# Patient Record
Sex: Male | Born: 2000 | Race: White | Hispanic: No | Marital: Single | Smoking: Never smoker
Health system: Southern US, Community
[De-identification: ages and names within clinical notes are randomized; demographics above are authoritative.]

## PROBLEM LIST (undated history)

## (undated) DIAGNOSIS — T7840XA Allergy, unspecified, initial encounter: Secondary | ICD-10-CM

## (undated) DIAGNOSIS — F32A Depression, unspecified: Secondary | ICD-10-CM

## (undated) DIAGNOSIS — F329 Major depressive disorder, single episode, unspecified: Secondary | ICD-10-CM

## (undated) HISTORY — DX: Allergy, unspecified, initial encounter: T78.40XA

## (undated) HISTORY — DX: Depression, unspecified: F32.A

---
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
## 2019-01-18 ENCOUNTER — Other Ambulatory Visit: Payer: Self-pay

## 2019-01-18 DIAGNOSIS — Z20822 Contact with and (suspected) exposure to covid-19: Secondary | ICD-10-CM

## 2019-01-20 LAB — NOVEL CORONAVIRUS, NAA: SARS-CoV-2, NAA: NOT DETECTED

## 2019-02-21 ENCOUNTER — Other Ambulatory Visit: Payer: Self-pay | Admitting: Physician Assistant

## 2019-02-21 DIAGNOSIS — F329 Major depressive disorder, single episode, unspecified: Secondary | ICD-10-CM

## 2019-02-21 DIAGNOSIS — F32A Depression, unspecified: Secondary | ICD-10-CM

## 2019-02-21 NOTE — Telephone Encounter (Signed)
Review for refill. Paxil 10 mg tab, has end date of 02/22/2018.

## 2019-02-23 NOTE — Telephone Encounter (Signed)
L.O.V. was 11/25/2018 and no upcoming appointment. Please advised.

## 2019-04-10 ENCOUNTER — Ambulatory Visit: Payer: No Typology Code available for payment source | Admitting: Physician Assistant

## 2019-04-11 ENCOUNTER — Other Ambulatory Visit: Payer: Self-pay

## 2019-04-11 ENCOUNTER — Telehealth (INDEPENDENT_AMBULATORY_CARE_PROVIDER_SITE_OTHER): Payer: No Typology Code available for payment source | Admitting: Physician Assistant

## 2019-04-11 DIAGNOSIS — J069 Acute upper respiratory infection, unspecified: Secondary | ICD-10-CM

## 2019-04-11 NOTE — Progress Notes (Signed)
Patient: Peter Gomez Male    DOB: 2000-12-27   19 y.o.   MRN: [id number] Visit Date: 04/11/2019  Today's Provider: [name], PA-C   Chief Complaint  Patient presents with  . URI   Subjective:    I [place] S. Dimas, CMA, am acting as Education administrator for [employer]. Virtual Visit via Video Note  I connected with Peter Gomez on 04/11/19 at 10:20 AM EST by a video enabled telemedicine application and verified that I am speaking with the correct person using two identifiers.  Location: Patient: Home Provider: Office   I discussed the limitations of evaluation and management by telemedicine and the availability of in person appointments. The patient expressed understanding and agreed to proceed.  HPI Upper Respiratory Infection: Patient complains of symptoms of a URI, possible sinusitis. Symptoms include left ear pain, congestion, cough and sore throat. Onset of symptoms was 3 days ago, unchanged since that time. He also c/o congestion, cough described as productive of clear sputum and post nasal drip for the past 3 days .  He is drinking plenty of fluids. Evaluation to date: none. Treatment to date: antihistamines, cough suppressants and decongestants.  Reports having a fever on Friday with a temperature 99.0 F. Reports younger brothers are sick in a similar way. Has tried mucinex to break up mucous and tylenol. Patient had COVID in 12/2018.   Allergies  Allergen Reactions  . Penicillins Other (See Comments)    Severe family history. No personal history known      Current Outpatient Medications:  .  PARoxetine (PAXIL) 10 MG tablet, TAKE 1 TABLET BY MOUTH EVERY DAY, Disp: 90 tablet, Rfl: 0  Review of Systems  Constitutional: Negative for chills and fever.  HENT: Positive for congestion, ear pain, postnasal drip, sinus pressure, sinus pain and sore throat.   Respiratory: Positive for cough.     Social History   Tobacco Use  . Smoking status: Never Smoker  .  Smokeless tobacco: Never Used  Substance Use Topics  . Alcohol use: Never      Objective:   There were no vitals taken for this visit. There were no vitals filed for this visit.There is no height or weight on file to calculate BMI.   Physical Exam Constitutional:      Appearance: Normal appearance.  Pulmonary:     Effort: Pulmonary effort is normal. No respiratory distress.  Neurological:     Mental Status: He is alert.  Psychiatric:        Mood and Affect: Mood normal.        Behavior: Behavior normal.      No results found for any visits on 04/11/19.     Assessment & Plan    1. Viral URI  As he had COVID within the past three months I think it is unlikely to be COVID but not impossible. I do not think he should be retested however due to risk of false positive. Has some policies with work to navigate. Counseled on 10 day course of illness which he is halfway through. Recommend adding anti-inflammatories like ibuprofen and Sudafed to Mucinex and tylenol over the next three days. If not improving, call back 04/17/2019 for abx.   Of note, patient did not show up to 04/10/2019 appointment at 4:00 PM as scheduled, which will be documented as a no-show.    I discussed the assessment and treatment plan with the patient. The patient was provided an opportunity  to ask questions and all were answered. The patient agreed with the plan and demonstrated an understanding of the instructions.   The patient was advised to call back or seek an in-person evaluation if the symptoms worsen or if the condition fails to improve as anticipated.  I provided 25 minutes of non-face-to-face time during this encounter.  The entirety of the information documented in the History of Present Illness, Review of Systems and Physical Exam were personally obtained by me. Portions of this information were initially documented by Rondel Baton, CMA and reviewed by me for thoroughness and accuracy.       Trey Sailors, PA-C  [facility]

## 2019-05-24 ENCOUNTER — Other Ambulatory Visit: Payer: Self-pay | Admitting: Physician Assistant

## 2019-05-24 DIAGNOSIS — F32A Depression, unspecified: Secondary | ICD-10-CM

## 2019-05-24 DIAGNOSIS — F329 Major depressive disorder, single episode, unspecified: Secondary | ICD-10-CM

## 2019-06-22 NOTE — Progress Notes (Signed)
I,Laura E Walsh,acting as a Neurosurgeon for [employer], PA-C.,have documented all relevant documentation on the behalf of Trey Sailors, PA-C,as directed by  Trey Sailors, PA-C while in the presence of Trey Sailors, PA-C.     Established patient visit   Patient: Peter Gomez   DOB: [birth year]   19 y.o. Male  MRN: [id number] Visit Date: 06/23/2019  Today's healthcare provider: Trey Sailors, PA-C   Chief Complaint  Patient presents with  . Back Pain    Chronic lower back pain.   Subjective    Back Pain This is a chronic problem. The problem has been gradually worsening since onset. The pain is present in the lumbar spine. The quality of the pain is described as aching and shooting (Throbbing). Radiates to: Radiates down both legs. The symptoms are aggravated by bending, sitting and standing. Associated symptoms include headaches (Recent Migraines.), leg pain, numbness, tingling and weakness. Pertinent negatives include no bladder incontinence, bowel incontinence, dysuria, fever or weight loss. He has tried home exercises and NSAIDs for the symptoms. The treatment provided no relief.     Patient Active Problem List   Diagnosis Date Noted  . Lumbar back pain 06/23/2019  . Postural kyphosis of thoracic region 06/23/2019  . Hearing loss 11/25/2018  . Depression 11/25/2018   No past surgical history on file. Allergies  Allergen Reactions  . Penicillins Other (See Comments)    Severe family history. No personal history known      Medications: Outpatient Medications Prior to Visit  Medication Sig  . PARoxetine (PAXIL) 10 MG tablet TAKE 1 TABLET BY MOUTH EVERY DAY   No facility-administered medications prior to visit.    Review of Systems  Constitutional: Negative.  Negative for fever and weight loss.  Respiratory: Negative.   Cardiovascular: Negative.   Gastrointestinal: Negative.  Negative for bowel incontinence.  Genitourinary: Negative for bladder  incontinence and dysuria.  Musculoskeletal: Positive for back pain. Negative for arthralgias, gait problem, joint swelling, myalgias, neck pain and neck stiffness.  Neurological: Positive for tingling, weakness, numbness and headaches (Recent Migraines.).  Hematological: Negative.       Objective    BP 112/72 (BP Location: Left Arm, Patient Position: Sitting, Cuff Size: Large)   Pulse 81   Temp (!) 97.1 F (36.2 C) (Temporal)   Wt 264 lb (119.7 kg)   SpO2 97%   BMI 35.80 kg/m    Physical Exam Constitutional:      Appearance: Normal appearance.  Eyes:     Conjunctiva/sclera: Conjunctivae normal.  Musculoskeletal:     Cervical back: Normal.     Lumbar back: Tenderness and bony tenderness present. Normal range of motion. Positive right straight leg raise test and positive left straight leg raise test.     Comments: Kyphosis noted on exam Thoracic back.  Skin:    General: Skin is warm and dry.  Neurological:     Mental Status: He is alert and oriented to person, place, and time. Mental status is at baseline.  Psychiatric:        Mood and Affect: Mood normal.        Behavior: Behavior normal.        Thought Content: Thought content normal.        Judgment: Judgment normal.       No results found for any visits on 06/23/19.  Assessment & Plan     1. Postural kyphosis of thoracic region  - DG Thoracic Spine  W/Swimmers  2. Lumbar back pain  Chronic and uncontrolled Will get X-rays Meloxicam and cyclobenzaprine as below. Advised pt that cyclobenzaprine may cause doziness, and also not to use other NSAIDs including ibuprofen, naproxen, and aspirin while taking Meloxicam.  Pt can take tylenol.  Discussed referral to Physical therapy or back specialist for further evaluation if symptoms do not improve or worsening.     - DG Cervical Spine Complete - DG Thoracic Spine W/Swimmers - DG Lumbar Spine Complete - meloxicam (MOBIC) 15 MG tablet; Take 1 tablet (15 mg total) by  mouth daily.  Dispense: 30 tablet; Refill: 0 - cyclobenzaprine (FLEXERIL) 10 MG tablet; Take 1 tablet (10 mg total) by mouth at bedtime.  Dispense: 30 tablet; Refill: 0   Return if symptoms worsen or fail to improve.      ITrinna Post, PA-C, have reviewed all documentation for this visit. The documentation on 06/23/19 for the exam, diagnosis, procedures, and orders are all accurate and complete.    Paulene Floor  [facility] [phone] (phone) [phone] (fax)  [place]

## 2019-06-23 ENCOUNTER — Other Ambulatory Visit: Payer: Self-pay

## 2019-06-23 ENCOUNTER — Ambulatory Visit
Admission: RE | Admit: 2019-06-23 | Discharge: 2019-06-23 | Disposition: A | Discharge status: Home / Self Care | Payer: No Typology Code available for payment source | Source: Ambulatory Visit | Attending: Physician Assistant | Admitting: Physician Assistant

## 2019-06-23 ENCOUNTER — Ambulatory Visit (INDEPENDENT_AMBULATORY_CARE_PROVIDER_SITE_OTHER): Payer: No Typology Code available for payment source | Admitting: Physician Assistant

## 2019-06-23 ENCOUNTER — Encounter: Payer: Self-pay | Admitting: Physician Assistant

## 2019-06-23 VITALS — BP 112/72 | HR 81 | Temp 97.1°F | Wt 264.0 lb

## 2019-06-23 DIAGNOSIS — M542 Cervicalgia: Secondary | ICD-10-CM | POA: Diagnosis not present

## 2019-06-23 DIAGNOSIS — M545 Low back pain, unspecified: Secondary | ICD-10-CM

## 2019-06-23 DIAGNOSIS — M4004 Postural kyphosis, thoracic region: Secondary | ICD-10-CM

## 2019-06-23 DIAGNOSIS — M546 Pain in thoracic spine: Secondary | ICD-10-CM | POA: Diagnosis not present

## 2019-06-23 MED ORDER — CYCLOBENZAPRINE HCL 10 MG PO TABS: 10.0000 mg | ORAL_TABLET | Freq: Every day | ORAL | 0 refills | Status: DC

## 2019-06-23 MED ORDER — MELOXICAM 15 MG PO TABS: 15.0000 mg | ORAL_TABLET | Freq: Every day | ORAL | 0 refills | Status: DC

## 2019-06-23 NOTE — Patient Instructions (Signed)
Acute Back Pain, Adult Acute back pain is sudden and usually short-lived. It is often caused by an injury to the muscles and tissues in the back. The injury may result from:  A muscle or ligament getting overstretched or torn (strained). Ligaments are tissues that connect bones to each other. Lifting something improperly can cause a back strain.  Wear and tear (degeneration) of the spinal disks. Spinal disks are circular tissue that provides cushioning between the bones of the spine (vertebrae).  Twisting motions, such as while playing sports or doing yard work.  A hit to the back.  Arthritis. You may have a physical exam, lab tests, and imaging tests to find the cause of your pain. Acute back pain usually goes away with rest and home care. Follow these instructions at home: Managing pain, stiffness, and swelling  Take over-the-counter and prescription medicines only as told by your health care provider.  Your health care provider may recommend applying ice during the first 24-48 hours after your pain starts. To do this: ? Put ice in a plastic bag. ? Place a towel between your skin and the bag. ? Leave the ice on for 20 minutes, 2-3 times a day.  If directed, apply heat to the affected area as often as told by your health care provider. Use the heat source that your health care provider recommends, such as a moist heat pack or a heating pad. ? Place a towel between your skin and the heat source. ? Leave the heat on for 20-30 minutes. ? Remove the heat if your skin turns bright red. This is especially important if you are unable to feel pain, heat, or cold. You have a greater risk of getting burned. Activity   Do not stay in bed. Staying in bed for more than 1-2 days can delay your recovery.  Sit up and stand up straight. Avoid leaning forward when you sit, or hunching over when you stand. ? If you work at a desk, sit close to it so you do not need to lean over. Keep your chin tucked  in. Keep your neck drawn back, and keep your elbows bent at a right angle. Your arms should look like the letter "L." ? Sit high and close to the steering wheel when you drive. Add lower back (lumbar) support to your car seat, if needed.  Take short walks on even surfaces as soon as you are able. Try to increase the length of time you walk each day.  Do not sit, drive, or stand in one place for more than 30 minutes at a time. Sitting or standing for long periods of time can put stress on your back.  Do not drive or use heavy machinery while taking prescription pain medicine.  Use proper lifting techniques. When you bend and lift, use positions that put less stress on your back: ? Bend your knees. ? Keep the load close to your body. ? Avoid twisting.  Exercise regularly as told by your health care provider. Exercising helps your back heal faster and helps prevent back injuries by keeping muscles strong and flexible.  Work with a physical therapist to make a safe exercise program, as recommended by your health care provider. Do any exercises as told by your physical therapist. Lifestyle  Maintain a healthy weight. Extra weight puts stress on your back and makes it difficult to have good posture.  Avoid activities or situations that make you feel anxious or stressed. Stress and anxiety increase muscle   tension and can make back pain worse. Learn ways to manage anxiety and stress, such as through exercise. General instructions  Sleep on a firm mattress in a comfortable position. Try lying on your side with your knees slightly bent. If you lie on your back, put a pillow under your knees.  Follow your treatment plan as told by your health care provider. This may include: ? Cognitive or behavioral therapy. ? Acupuncture or massage therapy. ? Meditation or yoga. Contact a health care provider if:  You have pain that is not relieved with rest or medicine.  You have increasing pain going down  into your legs or buttocks.  Your pain does not improve after 2 weeks.  You have pain at night.  You lose weight without trying.  You have a fever or chills. Get help right away if:  You develop new bowel or bladder control problems.  You have unusual weakness or numbness in your arms or legs.  You develop nausea or vomiting.  You develop abdominal pain.  You feel faint. Summary  Acute back pain is sudden and usually short-lived.  Use proper lifting techniques. When you bend and lift, use positions that put less stress on your back.  Take over-the-counter and prescription medicines and apply heat or ice as directed by your health care provider. This information is not intended to replace advice given to you by your health care provider. Make sure you discuss any questions you have with your health care provider. Document Revised: 05/24/2018 Document Reviewed: 09/16/2016 Elsevier Patient Education  2020 [employer]  

## 2019-06-26 ENCOUNTER — Encounter: Payer: Self-pay | Admitting: Physician Assistant

## 2019-06-26 DIAGNOSIS — M545 Low back pain, unspecified: Secondary | ICD-10-CM

## 2019-08-04 ENCOUNTER — Other Ambulatory Visit: Payer: Self-pay | Admitting: Physical Medicine & Rehabilitation

## 2019-08-04 DIAGNOSIS — M5416 Radiculopathy, lumbar region: Secondary | ICD-10-CM

## 2019-08-09 NOTE — Progress Notes (Deleted)
     Established patient visit   Patient: Peter Gomez   DOB: Jan 24, 2001   19 y.o. Male  MRN: [id number] Visit Date: 08/10/2019  Today's healthcare provider: Trey Sailors, PA-C   No chief complaint on file.  Subjective    HPI Depression, Follow-up  He  was last seen for this 8 months ago. Changes made at last visit include started Paxil 10 mg.   He reports {excellent/good/fair/poor:19665} compliance with treatment. He {is/is not:21021397} having side effects. ***  He reports {DESC; GOOD/FAIR/POOR:18685} tolerance of treatment. Current symptoms include: {Symptoms; depression:1002} He feels he is {improved/worse/unchanged:3041574} since last visit.  Depression screen [facility] 2/9 04/11/2019 11/25/2018  Decreased Interest 0 1  Down, Depressed, Hopeless 1 1  PHQ - 2 Score 1 2  Altered sleeping 1 2  Tired, decreased energy 1 1  Change in appetite 0 0  Feeling bad or failure about yourself  0 0  Trouble concentrating 1 0  Moving slowly or fidgety/restless 0 0  Suicidal thoughts 0 0  PHQ-9 Score 4 5  Difficult doing work/chores Somewhat difficult Somewhat difficult    -----------------------------------------------------------------------------------------   {Show patient history (optional):23778::" "}   Medications: Outpatient Medications Prior to Visit  Medication Sig  . cyclobenzaprine (FLEXERIL) 10 MG tablet Take 1 tablet (10 mg total) by mouth at bedtime.  . meloxicam (MOBIC) 15 MG tablet Take 1 tablet (15 mg total) by mouth daily.  Marland Kitchen PARoxetine (PAXIL) 10 MG tablet TAKE 1 TABLET BY MOUTH EVERY DAY   No facility-administered medications prior to visit.    Review of Systems  {Heme  Chem  Endocrine  Serology  Results Review (optional):23779::" "}  Objective    There were no vitals taken for this visit. {Show previous vital signs (optional):23777::" "}  Physical Exam  ***  No results found for any visits on 08/10/19.  Assessment & Plan     ***  No  follow-ups on file.      {provider attestation***:1}   Maryella Shivers  [facility] [phone] (phone) [phone] (fax)  [facility]

## 2019-08-10 ENCOUNTER — Ambulatory Visit: Payer: No Typology Code available for payment source | Admitting: Physician Assistant

## 2019-08-17 ENCOUNTER — Other Ambulatory Visit: Payer: Self-pay

## 2019-08-17 ENCOUNTER — Ambulatory Visit
Admission: RE | Admit: 2019-08-17 | Discharge: 2019-08-17 | Disposition: A | Discharge status: Home / Self Care | Payer: Medicaid Other | Source: Ambulatory Visit | Attending: Physical Medicine & Rehabilitation | Admitting: Physical Medicine & Rehabilitation

## 2019-08-17 DIAGNOSIS — M5416 Radiculopathy, lumbar region: Secondary | ICD-10-CM | POA: Diagnosis not present

## 2019-08-17 DIAGNOSIS — Z419 Encounter for procedure for purposes other than remedying health state, unspecified: Secondary | ICD-10-CM | POA: Diagnosis not present

## 2019-09-17 DIAGNOSIS — Z419 Encounter for procedure for purposes other than remedying health state, unspecified: Secondary | ICD-10-CM | POA: Diagnosis not present

## 2019-09-22 DIAGNOSIS — G8929 Other chronic pain: Secondary | ICD-10-CM | POA: Diagnosis not present

## 2019-09-22 DIAGNOSIS — M5442 Lumbago with sciatica, left side: Secondary | ICD-10-CM | POA: Diagnosis not present

## 2019-09-22 DIAGNOSIS — M5441 Lumbago with sciatica, right side: Secondary | ICD-10-CM | POA: Diagnosis not present

## 2019-10-18 DIAGNOSIS — Z419 Encounter for procedure for purposes other than remedying health state, unspecified: Secondary | ICD-10-CM | POA: Diagnosis not present

## 2019-11-17 DIAGNOSIS — Z419 Encounter for procedure for purposes other than remedying health state, unspecified: Secondary | ICD-10-CM | POA: Diagnosis not present

## 2019-11-23 DIAGNOSIS — R55 Syncope and collapse: Secondary | ICD-10-CM | POA: Diagnosis not present

## 2019-11-23 DIAGNOSIS — Z8249 Family history of ischemic heart disease and other diseases of the circulatory system: Secondary | ICD-10-CM | POA: Diagnosis not present

## 2019-11-23 DIAGNOSIS — Z1589 Genetic susceptibility to other disease: Secondary | ICD-10-CM | POA: Diagnosis not present

## 2019-12-18 DIAGNOSIS — Z419 Encounter for procedure for purposes other than remedying health state, unspecified: Secondary | ICD-10-CM | POA: Diagnosis not present

## 2020-01-17 DIAGNOSIS — Z419 Encounter for procedure for purposes other than remedying health state, unspecified: Secondary | ICD-10-CM | POA: Diagnosis not present

## 2020-02-01 DIAGNOSIS — Z20822 Contact with and (suspected) exposure to covid-19: Secondary | ICD-10-CM | POA: Diagnosis not present

## 2020-02-17 DIAGNOSIS — Z419 Encounter for procedure for purposes other than remedying health state, unspecified: Secondary | ICD-10-CM | POA: Diagnosis not present

## 2020-03-19 DIAGNOSIS — Z419 Encounter for procedure for purposes other than remedying health state, unspecified: Secondary | ICD-10-CM | POA: Diagnosis not present

## 2020-04-16 DIAGNOSIS — Z419 Encounter for procedure for purposes other than remedying health state, unspecified: Secondary | ICD-10-CM | POA: Diagnosis not present

## 2020-05-17 DIAGNOSIS — Z419 Encounter for procedure for purposes other than remedying health state, unspecified: Secondary | ICD-10-CM | POA: Diagnosis not present

## 2020-06-16 DIAGNOSIS — Z419 Encounter for procedure for purposes other than remedying health state, unspecified: Secondary | ICD-10-CM | POA: Diagnosis not present

## 2020-07-17 DIAGNOSIS — Z419 Encounter for procedure for purposes other than remedying health state, unspecified: Secondary | ICD-10-CM | POA: Diagnosis not present

## 2020-08-16 DIAGNOSIS — Z419 Encounter for procedure for purposes other than remedying health state, unspecified: Secondary | ICD-10-CM | POA: Diagnosis not present

## 2020-09-16 DIAGNOSIS — Z419 Encounter for procedure for purposes other than remedying health state, unspecified: Secondary | ICD-10-CM | POA: Diagnosis not present

## 2020-10-17 DIAGNOSIS — Z419 Encounter for procedure for purposes other than remedying health state, unspecified: Secondary | ICD-10-CM | POA: Diagnosis not present

## 2020-11-16 DIAGNOSIS — Z419 Encounter for procedure for purposes other than remedying health state, unspecified: Secondary | ICD-10-CM | POA: Diagnosis not present

## 2020-12-17 DIAGNOSIS — Z419 Encounter for procedure for purposes other than remedying health state, unspecified: Secondary | ICD-10-CM | POA: Diagnosis not present

## 2020-12-20 DIAGNOSIS — J029 Acute pharyngitis, unspecified: Secondary | ICD-10-CM | POA: Diagnosis not present

## 2020-12-20 DIAGNOSIS — R051 Acute cough: Secondary | ICD-10-CM | POA: Diagnosis not present

## 2021-01-16 DIAGNOSIS — Z419 Encounter for procedure for purposes other than remedying health state, unspecified: Secondary | ICD-10-CM | POA: Diagnosis not present

## 2021-02-16 DIAGNOSIS — Z419 Encounter for procedure for purposes other than remedying health state, unspecified: Secondary | ICD-10-CM | POA: Diagnosis not present

## 2021-03-19 DIAGNOSIS — Z419 Encounter for procedure for purposes other than remedying health state, unspecified: Secondary | ICD-10-CM | POA: Diagnosis not present

## 2021-04-16 DIAGNOSIS — Z419 Encounter for procedure for purposes other than remedying health state, unspecified: Secondary | ICD-10-CM | POA: Diagnosis not present

## 2021-05-17 DIAGNOSIS — Z419 Encounter for procedure for purposes other than remedying health state, unspecified: Secondary | ICD-10-CM | POA: Diagnosis not present

## 2021-06-02 IMAGING — MR MR LUMBAR SPINE W/O CM
5 series · 31 of 48 positions shown · non-contrast
Comparison: Prior radiograph from 06/23/2019.

CLINICAL DATA: Initial evaluation for progressively worsening
central and left lower back pain, occasional left hip, buttock, and
leg pain.

EXAM:
MRI LUMBAR SPINE WITHOUT CONTRAST
TECHNIQUE: Multiplanar, multisequence MR imaging of the lumbar spine was
performed. No intravenous contrast was administered.

[Series 9: T2 · sagittal · 4.0mm · 0.81mm/px · 6 of 17 slices shown (1 of 2)]
[im 1/17]
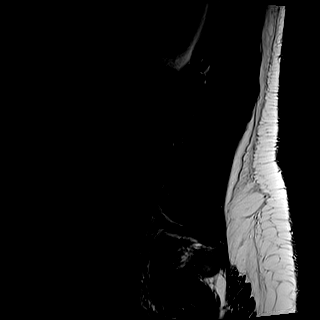
[im 4/17]
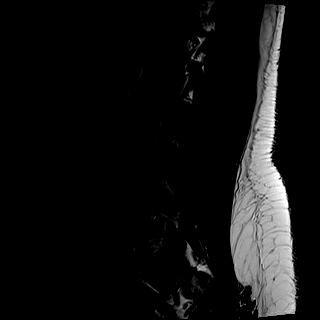
[im 7/17]
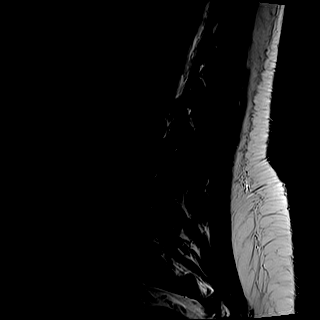
[im 10/17]
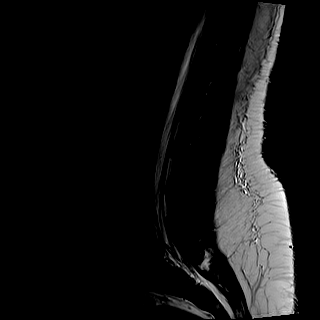
[im 13/17]
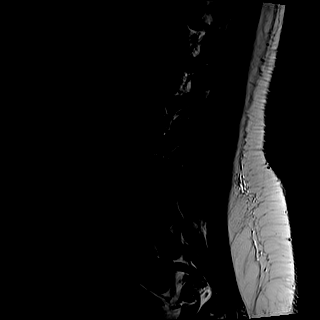
[im 17/17]
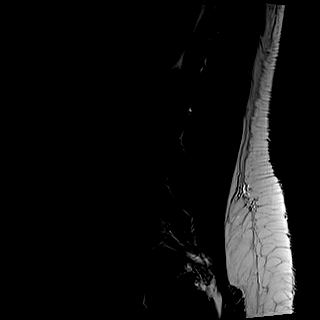

[Series 10: T1 · sagittal · 4.0mm · 0.81mm/px · 7 of 17 slices shown (1 of 2)]
[im 1/17]
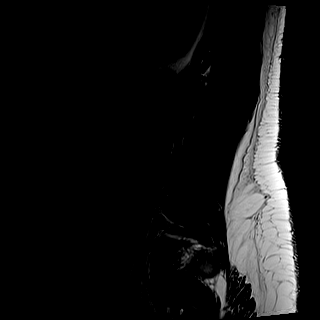
[im 3/17]
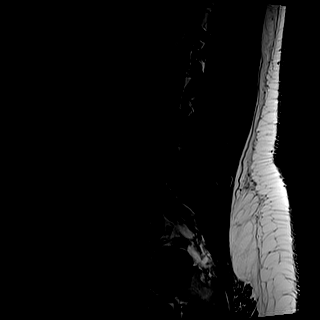
[im 6/17]
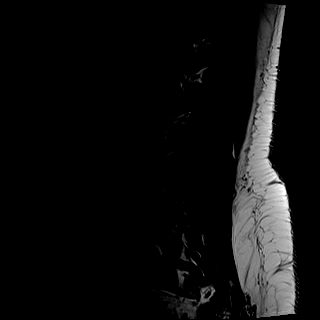
[im 9/17]
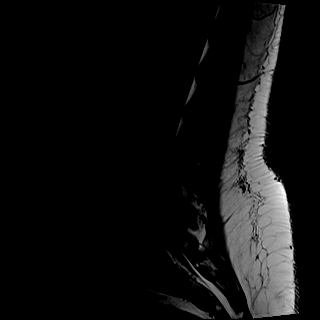
[im 11/17]
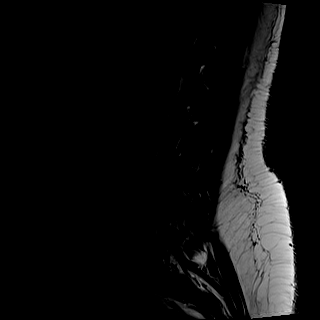
[im 14/17]
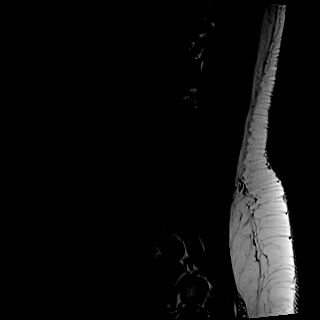
[im 17/17]
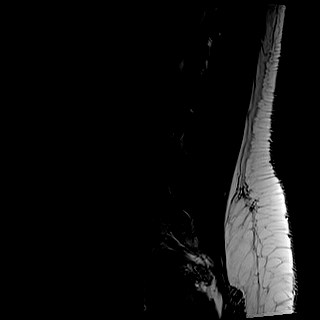

[Series 11: STIR · sagittal · 4.0mm · 0.41mm/px · 2 of 17 slices shown]
[im 1/17]
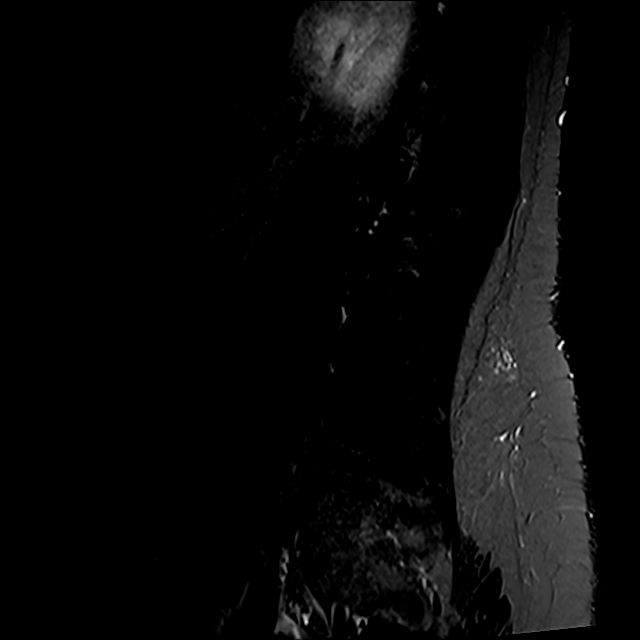
[im 3/17]
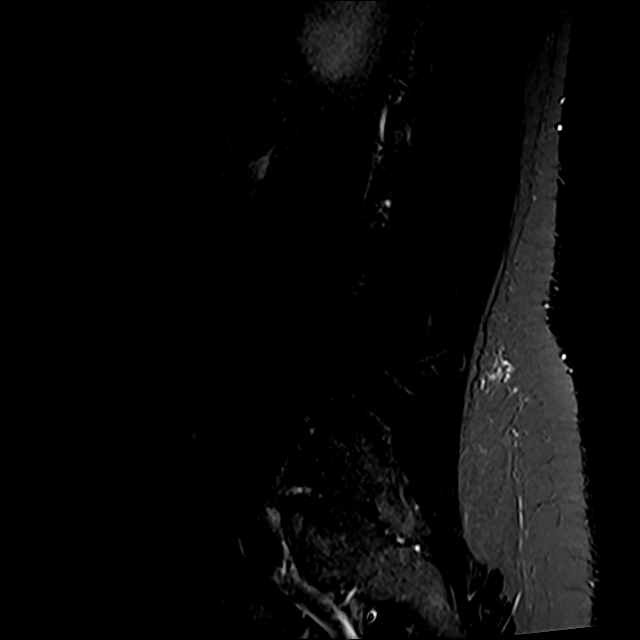

[Series 12: T2 · axial · 4.0mm · 0.78mm/px · z∈[+33,+250]mm · 8 of 36 slices shown (2 of 2)]
[im 1/36]
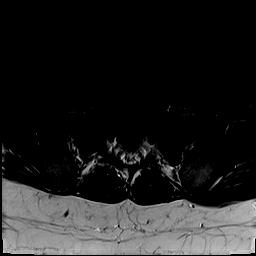
[im 6/36]
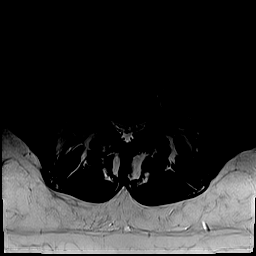
[im 11/36]
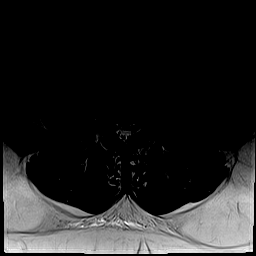
[im 17/36]
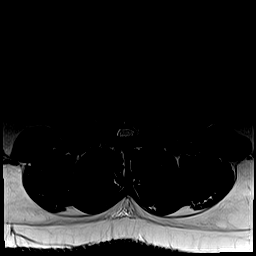
[im 19/36]
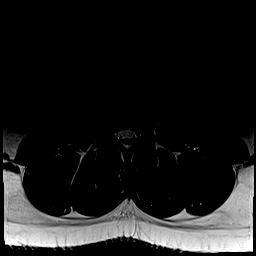
[im 25/36]
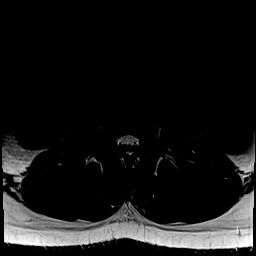
[im 30/36]
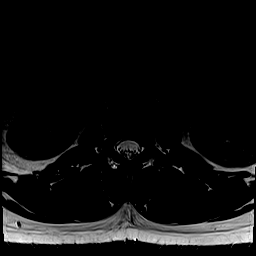
[im 36/36]
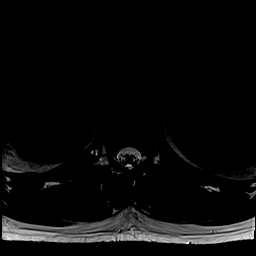

[Series 13: T1 · axial · 4.0mm · 0.39mm/px · z∈[+33,+250]mm · 8 of 36 slices shown (2 of 2)]
[im 1/36]
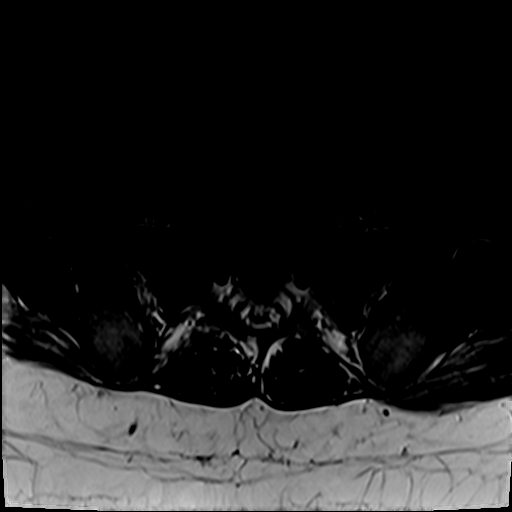
[im 6/36]
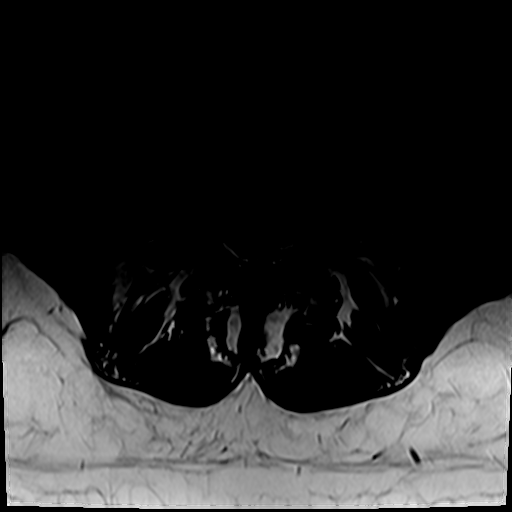
[im 11/36]
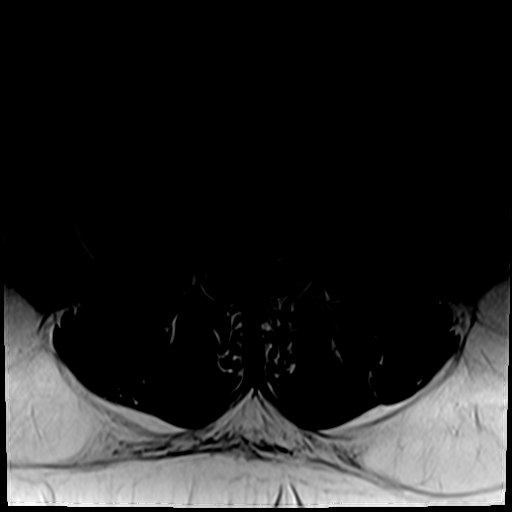
[im 17/36]
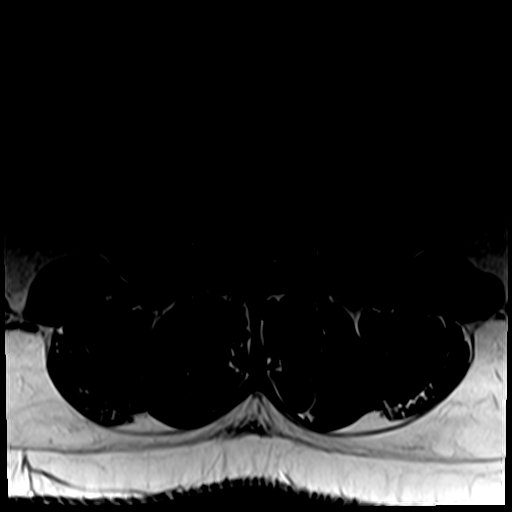
[im 19/36]
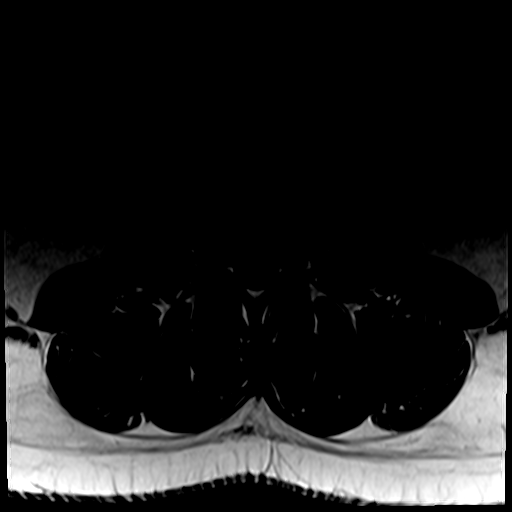
[im 25/36]
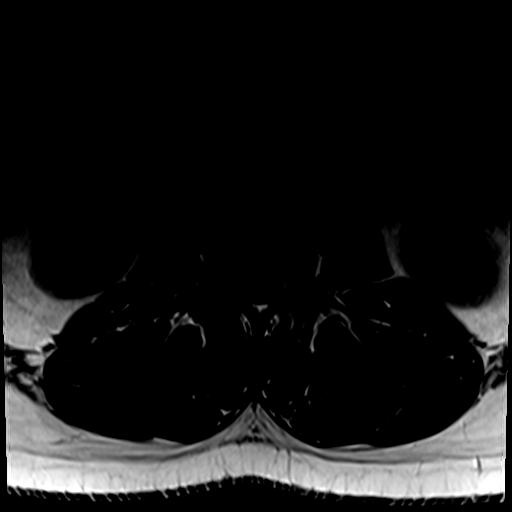
[im 30/36]
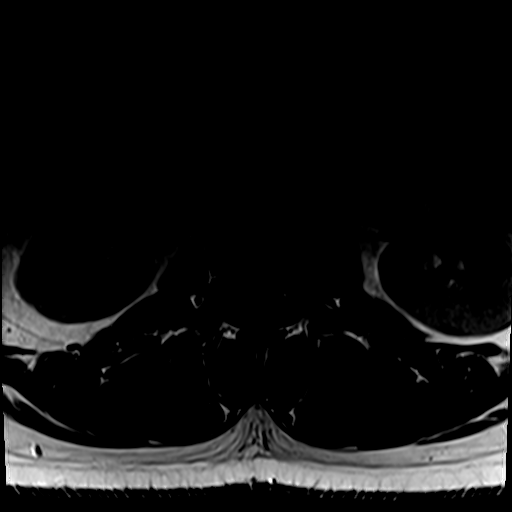
[im 36/36]
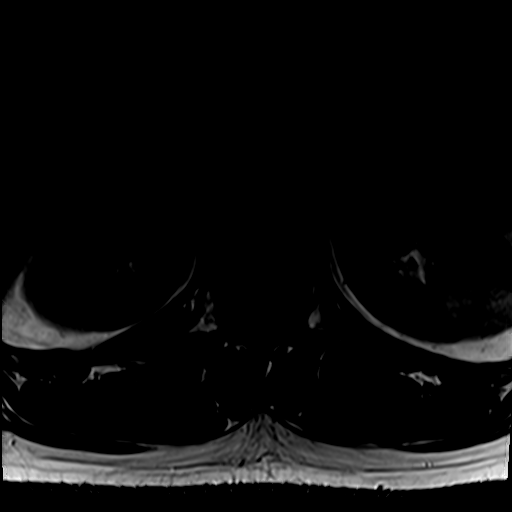

[31 of 48 positions shown; findings below may reference images not displayed]

FINDINGS: Segmentation: Standard. Lowest well-formed disc space labeled the
L5-S1 level.

Alignment: Physiologic with preservation of the normal lumbar
lordosis. No listhesis or subluxation.

Vertebrae: Vertebral body height well maintained without evidence
for acute or chronic fracture. Bone marrow signal intensity within
normal limits for age. No discrete or worrisome osseous lesions.
Mild reactive endplate changes noted about the L5-S1 interspace. No
abnormal marrow edema.

Conus medullaris and cauda equina: Conus extends to the L1 level.
Conus and cauda equina appear normal.

Paraspinal and other soft tissues: Paraspinous soft tissues within
normal limits. Visualized visceral structures are normal.

Disc levels:

L1-2:  Unremarkable.

L2-3:  Unremarkable.

L3-4: Normal interspace. Mild facet hypertrophy. No canal or
foraminal stenosis.

L4-5: Disc desiccation. Shallow central disc protrusion indents the
ventral thecal sac (series 12, image 28). Superimposed mild facet
hypertrophy. Resultant mild canal with mild left greater than right
lateral recess stenosis. Foramina remain patent.

L5-S1: Mild diffuse disc bulge with disc desiccation. Associated
mild reactive endplate changes. No significant canal or lateral
recess stenosis. Foramina remain patent.
IMPRESSION: 1. Shallow central disc protrusion at L4-5 with resultant mild
spinal stenosis, with mild left greater than right lateral recess
narrowing. Finding could contribute to left lower extremity
radicular symptoms.
2. Mild noncompressive disc bulging at L5-S1 without stenosis or
neural impingement.

## 2021-06-16 DIAGNOSIS — Z419 Encounter for procedure for purposes other than remedying health state, unspecified: Secondary | ICD-10-CM | POA: Diagnosis not present

## 2021-07-17 DIAGNOSIS — Z419 Encounter for procedure for purposes other than remedying health state, unspecified: Secondary | ICD-10-CM | POA: Diagnosis not present

## 2021-08-16 DIAGNOSIS — Z419 Encounter for procedure for purposes other than remedying health state, unspecified: Secondary | ICD-10-CM | POA: Diagnosis not present

## 2021-09-16 DIAGNOSIS — Z419 Encounter for procedure for purposes other than remedying health state, unspecified: Secondary | ICD-10-CM | POA: Diagnosis not present

## 2021-10-17 DIAGNOSIS — Z419 Encounter for procedure for purposes other than remedying health state, unspecified: Secondary | ICD-10-CM | POA: Diagnosis not present

## 2021-11-16 DIAGNOSIS — Z419 Encounter for procedure for purposes other than remedying health state, unspecified: Secondary | ICD-10-CM | POA: Diagnosis not present

## 2021-12-17 DIAGNOSIS — Z419 Encounter for procedure for purposes other than remedying health state, unspecified: Secondary | ICD-10-CM | POA: Diagnosis not present

## 2022-01-16 DIAGNOSIS — Z419 Encounter for procedure for purposes other than remedying health state, unspecified: Secondary | ICD-10-CM | POA: Diagnosis not present

## 2022-02-16 DIAGNOSIS — Z419 Encounter for procedure for purposes other than remedying health state, unspecified: Secondary | ICD-10-CM | POA: Diagnosis not present

## 2022-03-19 DIAGNOSIS — Z419 Encounter for procedure for purposes other than remedying health state, unspecified: Secondary | ICD-10-CM | POA: Diagnosis not present

## 2022-04-17 DIAGNOSIS — Z419 Encounter for procedure for purposes other than remedying health state, unspecified: Secondary | ICD-10-CM | POA: Diagnosis not present

## 2022-05-18 DIAGNOSIS — Z419 Encounter for procedure for purposes other than remedying health state, unspecified: Secondary | ICD-10-CM | POA: Diagnosis not present

## 2022-06-17 DIAGNOSIS — Z419 Encounter for procedure for purposes other than remedying health state, unspecified: Secondary | ICD-10-CM | POA: Diagnosis not present

## 2022-07-18 DIAGNOSIS — Z419 Encounter for procedure for purposes other than remedying health state, unspecified: Secondary | ICD-10-CM | POA: Diagnosis not present

## 2022-08-17 DIAGNOSIS — Z419 Encounter for procedure for purposes other than remedying health state, unspecified: Secondary | ICD-10-CM | POA: Diagnosis not present

## 2022-09-17 DIAGNOSIS — Z419 Encounter for procedure for purposes other than remedying health state, unspecified: Secondary | ICD-10-CM | POA: Diagnosis not present

## 2022-10-18 DIAGNOSIS — Z419 Encounter for procedure for purposes other than remedying health state, unspecified: Secondary | ICD-10-CM | POA: Diagnosis not present

## 2022-11-17 DIAGNOSIS — Z419 Encounter for procedure for purposes other than remedying health state, unspecified: Secondary | ICD-10-CM | POA: Diagnosis not present

## 2022-12-18 DIAGNOSIS — Z419 Encounter for procedure for purposes other than remedying health state, unspecified: Secondary | ICD-10-CM | POA: Diagnosis not present

## 2023-01-17 DIAGNOSIS — Z419 Encounter for procedure for purposes other than remedying health state, unspecified: Secondary | ICD-10-CM | POA: Diagnosis not present

## 2023-02-17 DIAGNOSIS — Z419 Encounter for procedure for purposes other than remedying health state, unspecified: Secondary | ICD-10-CM | POA: Diagnosis not present

## 2023-03-20 DIAGNOSIS — Z419 Encounter for procedure for purposes other than remedying health state, unspecified: Secondary | ICD-10-CM | POA: Diagnosis not present

## 2023-04-17 DIAGNOSIS — Z419 Encounter for procedure for purposes other than remedying health state, unspecified: Secondary | ICD-10-CM | POA: Diagnosis not present

## 2023-05-29 DIAGNOSIS — Z419 Encounter for procedure for purposes other than remedying health state, unspecified: Secondary | ICD-10-CM | POA: Diagnosis not present

## 2023-06-28 DIAGNOSIS — Z419 Encounter for procedure for purposes other than remedying health state, unspecified: Secondary | ICD-10-CM | POA: Diagnosis not present

## 2023-07-29 DIAGNOSIS — Z419 Encounter for procedure for purposes other than remedying health state, unspecified: Secondary | ICD-10-CM | POA: Diagnosis not present

## 2023-08-28 DIAGNOSIS — Z419 Encounter for procedure for purposes other than remedying health state, unspecified: Secondary | ICD-10-CM | POA: Diagnosis not present

## 2023-09-28 DIAGNOSIS — Z419 Encounter for procedure for purposes other than remedying health state, unspecified: Secondary | ICD-10-CM | POA: Diagnosis not present

## 2023-10-22 ENCOUNTER — Encounter: Payer: Self-pay | Admitting: General Practice

## 2023-10-22 ENCOUNTER — Ambulatory Visit: Payer: Self-pay | Admitting: General Practice

## 2023-10-22 ENCOUNTER — Ambulatory Visit: Admitting: General Practice

## 2023-10-22 VITALS — BP 122/84 | HR 95 | Temp 98.4°F | Ht 72.0 in | Wt 273.0 lb

## 2023-10-22 DIAGNOSIS — Z7689 Persons encountering health services in other specified circumstances: Secondary | ICD-10-CM | POA: Diagnosis not present

## 2023-10-22 DIAGNOSIS — J029 Acute pharyngitis, unspecified: Secondary | ICD-10-CM | POA: Diagnosis not present

## 2023-10-22 LAB — POC COVID19 BINAXNOW: SARS Coronavirus 2 Ag: NEGATIVE

## 2023-10-22 LAB — POCT INFLUENZA A/B
Influenza A, POC: NEGATIVE
Influenza B, POC: NEGATIVE

## 2023-10-22 LAB — POCT RAPID STREP A (OFFICE): Rapid Strep A Screen: NEGATIVE

## 2023-10-22 NOTE — Assessment & Plan Note (Signed)
 EMR reviewed briefly.

## 2023-10-22 NOTE — Patient Instructions (Addendum)
 You can try a few things over the counter to help with your symptoms including:  Cough: Delsym or Robitussin (get the off brand, works just as well) Chest Congestion: Mucinex (plain) Nasal Congestion/Ear Pressure/Sinus Pressure: Try using Flonase (fluticasone) nasal spray. Instill 1 spray in each nostril twice daily. This can be purchased over the counter. Body aches, fevers, headache: Ibuprofen (not to exceed 2400 mg in 24 hours) or Acetaminophen-Tylenol (not to exceed 3000 mg in 24 hours) Runny Nose/Throat Drainage/Sneezing/Itchy or Watery Eyes: An antihistamine such as Zyrtec, Claritin, Xyzal, Allegra  You should be feeling better by day seven of symptoms, but please do schedule an appointment if this is not the case.  It was a pleasure to meet you today! Please don't hesitate to contact me with any questions. Welcome to [facility]!

## 2023-10-22 NOTE — Progress Notes (Signed)
 New Patient Office Visit  Subjective    Patient ID: Adrien Shankar, male    DOB: Oct 03, 2000  Age: 23 y.o. MRN: [id number]  CC:  Chief Complaint  Patient presents with   New Patient (Initial Visit)   Sore Throat    And some drainage since Wednesday. Patient had been taking allergy medication but not helping.     HPI Moksh Loomer is a 23 y.o. male presents to establish care.  Previous PCP/physical/labs: [facility]  Discussed the use of AI scribe software for clinical note transcription with the patient, who gave verbal consent to proceed.  History of Present Illness Sheena Simonis is a 23 year old male who presents with a sore throat and associated symptoms.  He began experiencing a sore throat on Wednesday afternoon. The sensation is described as 'feeling like I'm on a rock' and is painful when swallowing. He also notes drainage at the back of the throat. No fever, chills, dizziness, or headaches are reported.  He experiences nasal congestion primarily on one side of the face but denies any sinus pain. He has not been knowingly exposed to any illnesses recently. He reports diarrhea that began on Wednesday evening, coinciding with the onset of the sore throat. No nausea or vomiting is present.  He has tried Zyrtec for his symptoms, but it has not provided relief. A dry cough started the morning of the visit, but he denies coughing up any sputum and does not feel any wheezing.  He mentions having some hearing issues, particularly in the left ear, where he has difficulty hearing. He denies ear pain but reports some hearing issues, particularly in the left ear.   Outpatient Encounter Medications as of 10/22/2023  Medication Sig   sertraline (ZOLOFT) 25 MG tablet 25 mg by Does not apply route daily.   [DISCONTINUED] cyclobenzaprine  (FLEXERIL ) 10 MG tablet Take 1 tablet (10 mg total) by mouth at bedtime.   [DISCONTINUED] meloxicam  (MOBIC ) 15 MG tablet Take 1 tablet (15 mg total)  by mouth daily.   [DISCONTINUED] PARoxetine  (PAXIL ) 10 MG tablet TAKE 1 TABLET BY MOUTH EVERY DAY   No facility-administered encounter medications on file as of 10/22/2023.    Past Medical History:  Diagnosis Date   Allergy    Depression     History reviewed. No pertinent surgical history.  Family History  Problem Relation Age of Onset   Heart disease Mother    Depression Father    Heart disease Maternal Grandmother    Cancer Maternal Grandmother    Depression Paternal Grandmother     Social History   Socioeconomic History   Marital status: Single    Spouse name: Not on file   Number of children: Not on file   Years of education: Not on file   Highest education level: Not on file  Occupational History   Not on file  Tobacco Use   Smoking status: Never   Smokeless tobacco: Never  Vaping Use   Vaping status: Never Used  Substance and Sexual Activity   Alcohol use: Never   Drug use: Never   Sexual activity: Not on file  Other Topics Concern   Not on file  Social History Narrative   Not on file   Social Drivers of Health   Financial Resource Strain: Not on file  Food Insecurity: Not on file  Transportation Needs: Not on file  Physical Activity: Not on file  Stress: Not on file  Social Connections: Not on file  Intimate  Partner Violence: Not on file    Review of Systems  Constitutional:  Negative for chills and fever.  HENT:  Positive for congestion and sore throat. Negative for ear pain.   Respiratory:  Positive for cough. Negative for sputum production, shortness of breath and wheezing.   Cardiovascular:  Negative for chest pain.  Gastrointestinal:  Positive for diarrhea. Negative for abdominal pain, constipation, heartburn, nausea and vomiting.  Genitourinary:  Negative for dysuria, frequency and urgency.  Neurological:  Negative for dizziness and headaches.  Endo/Heme/Allergies:  Negative for polydipsia.  Psychiatric/Behavioral:  Negative for depression  and suicidal ideas. The patient is not nervous/anxious.         Objective    BP 122/84   Pulse 95   Temp 98.4 F (36.9 C) (Temporal)   Ht 6' (1.829 m)   Wt 273 lb (123.8 kg)   SpO2 97%   BMI 37.03 kg/m   Physical Exam Vitals and nursing note reviewed.  Constitutional:      Appearance: Normal appearance.  HENT:     Right Ear: Tympanic membrane, ear canal and external ear normal.     Left Ear: Ear canal and external ear normal. A middle ear effusion is present.     Nose: Nose normal.     Right Turbinates: Not enlarged, swollen or pale.     Left Turbinates: Not enlarged, swollen or pale.     Right Sinus: No maxillary sinus tenderness or frontal sinus tenderness.     Left Sinus: No maxillary sinus tenderness or frontal sinus tenderness.  Cardiovascular:     Rate and Rhythm: Normal rate and regular rhythm.     Pulses: Normal pulses.     Heart sounds: Normal heart sounds.  Pulmonary:     Effort: Pulmonary effort is normal.     Breath sounds: Normal breath sounds.  Neurological:     Mental Status: He is alert and oriented to person, place, and time.  Psychiatric:        Mood and Affect: Mood normal.        Behavior: Behavior normal.        Thought Content: Thought content normal.        Judgment: Judgment normal.         Assessment & Plan:  Sore throat -     POCT rapid strep A -     POC COVID-19 BinaxNow -     POCT Influenza A/B  Establishing care with new doctor, encounter for Assessment & Plan: EMR reviewed briefly.      Assessment and Plan Assessment & Plan Acute pharyngitis Sore throat with drainage and odynophagia. Differential includes streptococcal pharyngitis, COVID-19, and influenza. - POC flu, covid and strep negative.  - Viral vs bacterial URI. Suspect viral URI. - Recommendations given for symptomatic treatment.  - Advised to rest, increase fluid intake and use humidifier.  - Monitor symptoms and report if he worsens, especially if tests are  negative.    Return in about 4 weeks (around 11/19/2023) for physical and same day fasting labs.SABRA Carrol Aurora, NP

## 2023-10-29 DIAGNOSIS — Z419 Encounter for procedure for purposes other than remedying health state, unspecified: Secondary | ICD-10-CM | POA: Diagnosis not present

## 2023-11-26 ENCOUNTER — Encounter: Payer: Self-pay | Admitting: General Practice

## 2023-11-26 ENCOUNTER — Ambulatory Visit (INDEPENDENT_AMBULATORY_CARE_PROVIDER_SITE_OTHER): Admitting: General Practice

## 2023-11-26 VITALS — BP 120/72 | HR 70 | Temp 98.0°F | Ht 72.0 in | Wt 276.0 lb

## 2023-11-26 DIAGNOSIS — E66812 Obesity, class 2: Secondary | ICD-10-CM

## 2023-11-26 DIAGNOSIS — Z1159 Encounter for screening for other viral diseases: Secondary | ICD-10-CM

## 2023-11-26 DIAGNOSIS — Z114 Encounter for screening for human immunodeficiency virus [HIV]: Secondary | ICD-10-CM

## 2023-11-26 DIAGNOSIS — F419 Anxiety disorder, unspecified: Secondary | ICD-10-CM

## 2023-11-26 DIAGNOSIS — Z0000 Encounter for general adult medical examination without abnormal findings: Secondary | ICD-10-CM | POA: Diagnosis not present

## 2023-11-26 DIAGNOSIS — H9191 Unspecified hearing loss, right ear: Secondary | ICD-10-CM

## 2023-11-26 DIAGNOSIS — Z23 Encounter for immunization: Secondary | ICD-10-CM | POA: Diagnosis not present

## 2023-11-26 DIAGNOSIS — F32A Depression, unspecified: Secondary | ICD-10-CM

## 2023-11-26 DIAGNOSIS — Z6837 Body mass index (BMI) 37.0-37.9, adult: Secondary | ICD-10-CM | POA: Insufficient documentation

## 2023-11-26 MED ORDER — SERTRALINE HCL 50 MG PO TABS: 50.0000 mg | ORAL_TABLET | Freq: Every day | ORAL | 0 refills | Status: AC

## 2023-11-26 NOTE — Assessment & Plan Note (Signed)
 Discussed the importance of healthy diet and exercise to affect sustainable weight loss.

## 2023-11-26 NOTE — Assessment & Plan Note (Signed)
 Uncontrolled.  Has tried sertraline before but not for long.  Agreeable to restart.   Start sertraline 25 mg once daily for one week and then increase to 50 mg thereafter. Rx sent.  F/u in 4 weeks.

## 2023-11-26 NOTE — Patient Instructions (Addendum)
 Your recent lab tests have resulted, and my comments are displayed below. Please do not hesitate to reach out with any questions!  Start sertraline 25 mg once daily for one week and then increase to 50 mg thereafter.   You will either be contacted via phone regarding your referral to ENT , or you may receive a letter on your MyChart portal from our referral team with instructions for scheduling an appointment. Please let us  know if you have not been contacted by anyone within two weeks.  Follow up in 4 weeks .  It was a pleasure to see you today!

## 2023-11-26 NOTE — Assessment & Plan Note (Signed)
 Chronic.  No red flags on exam.  Referral placed for ENT.

## 2023-11-26 NOTE — Progress Notes (Signed)
 Established Patient Office Visit  Subjective   Patient ID: Peter Gomez, male    DOB: 2000/06/14  Age: 23 y.o. MRN: [id number]  Chief Complaint  Patient presents with   Annual Exam    Wants labs done     HPI  Peter Gomez is a 23 year old male with past medical history of kyphosis, depression, back pain presents today for complete physical and follow up of chronic conditions.  Immunizations: -Tetanus: Completed in 2013; due -Influenza: due this season; declines  Diet: Fair diet.  Exercise: No regular exercise.  Eye exam: Completed several years ago. Dental exam: Completes semi-annually    Anxiety and depression: easily irritated and angry. Has some off days and some days are good days. Over-thinks a lot. Low self-esteem. Denies SI/HI. He is not sure if the short-term memory loss is from anxiety or depression. He denies SI/HI.   Hearing loss: right ear. Has been going on for a while. He has ringing in the ear. He has been intermittent pain; no discharge. He does use q-tips constantly. He wants to be referred to ENT.  Patient Active Problem List   Diagnosis Date Noted   Encounter for screening and preventative care 11/26/2023   Class 2 severe obesity due to excess calories with serious comorbidity and body mass index (BMI) of 37.0 to 37.9 in adult 11/26/2023   Establishing care with new doctor, encounter for 10/22/2023   Sore throat 10/22/2023   Lumbar back pain 06/23/2019   Postural kyphosis of thoracic region 06/23/2019   Hearing loss 11/25/2018   Anxiety and depression 11/25/2018   Past Medical History:  Diagnosis Date   Allergy    Depression    History reviewed. No pertinent surgical history. Allergies  Allergen Reactions   Penicillins Other (See Comments)    Severe family history. No personal history known          10/22/2023    9:40 AM 04/11/2019   10:28 AM 11/25/2018    3:30 PM  Depression screen PHQ 2/9  Decreased Interest 1 0 1  Down, Depressed,  Hopeless 1 1 1   PHQ - 2 Score 2 1 2   Altered sleeping 2 1 2   Tired, decreased energy 3 1 1   Change in appetite 2 0 0  Feeling bad or failure about yourself  2 0 0  Trouble concentrating 2 1 0  Moving slowly or fidgety/restless 0 0 0  Suicidal thoughts 0 0 0  PHQ-9 Score 13 4 5   Difficult doing work/chores Somewhat difficult Somewhat difficult Somewhat difficult       10/22/2023    9:40 AM  GAD 7 : Generalized Anxiety Score  Nervous, Anxious, on Edge 1  Control/stop worrying 2  Worry too much - different things 2  Trouble relaxing 2  Restless 1  Easily annoyed or irritable 2  Afraid - awful might happen 0  Total GAD 7 Score 10  Anxiety Difficulty Somewhat difficult      Review of Systems  Constitutional:  Negative for chills, fever, malaise/fatigue and weight loss.  HENT:  Positive for hearing loss and tinnitus. Negative for congestion, ear discharge, ear pain, nosebleeds, sinus pain and sore throat.   Eyes:  Negative for blurred vision, double vision, pain, discharge and redness.  Respiratory:  Negative for cough, shortness of breath, wheezing and stridor.   Cardiovascular:  Negative for chest pain, palpitations and leg swelling.  Gastrointestinal:  Negative for abdominal pain, constipation, diarrhea, heartburn, nausea and vomiting.  Genitourinary:  Negative for dysuria, frequency and urgency.  Musculoskeletal:  Negative for myalgias.  Skin:  Negative for rash.  Neurological:  Negative for dizziness, tingling, seizures, weakness and headaches.  Psychiatric/Behavioral:  Positive for depression. Negative for substance abuse and suicidal ideas. The patient is nervous/anxious.       Objective:     BP 120/72   Pulse 70   Temp 98 F (36.7 C) (Oral)   Ht 6' (1.829 m)   Wt 276 lb (125.2 kg)   SpO2 98%   BMI 37.43 kg/m  BP Readings from Last 3 Encounters:  11/26/23 120/72  10/22/23 122/84  06/23/19 112/72   Wt Readings from Last 3 Encounters:  11/26/23 276 lb (125.2  kg)  10/22/23 273 lb (123.8 kg)  06/23/19 264 lb (119.7 kg)      Physical Exam Vitals and nursing note reviewed.  Constitutional:      Appearance: Normal appearance.  HENT:     Head: Normocephalic and atraumatic.     Right Ear: Tympanic membrane, ear canal and external ear normal.     Left Ear: Tympanic membrane, ear canal and external ear normal.     Nose: Nose normal.     Mouth/Throat:     Mouth: Mucous membranes are moist.     Pharynx: Oropharynx is clear.  Eyes:     Conjunctiva/sclera: Conjunctivae normal.     Pupils: Pupils are equal, round, and reactive to light.  Cardiovascular:     Rate and Rhythm: Normal rate and regular rhythm.     Pulses: Normal pulses.     Heart sounds: Normal heart sounds.  Pulmonary:     Effort: Pulmonary effort is normal.     Breath sounds: Normal breath sounds.  Abdominal:     General: Abdomen is flat. Bowel sounds are normal.     Palpations: Abdomen is soft.  Musculoskeletal:        General: Normal range of motion.     Cervical back: Normal range of motion.  Skin:    General: Skin is warm and dry.     Capillary Refill: Capillary refill takes less than 2 seconds.  Neurological:     General: No focal deficit present.     Mental Status: He is alert and oriented to person, place, and time. Mental status is at baseline.  Psychiatric:        Mood and Affect: Mood normal.        Behavior: Behavior normal.        Thought Content: Thought content normal.        Judgment: Judgment normal.      No results found for any visits on 11/26/23.     The ASCVD Risk score (Arnett DK, et al., 2019) failed to calculate for the following reasons:   The 2019 ASCVD risk score is only valid for ages 58 to 45    Assessment & Plan:  Encounter for screening and preventative care Assessment & Plan: Immunizations tdap given today; declines influenza vaccine  Discussed the importance of a healthy diet and regular exercise in order for weight loss, and  to reduce the risk of further co-morbidity.  Exam stable. Labs pending.  Follow up in 1 year for repeat physical.   Orders: -     CBC -     Comprehensive metabolic panel with GFR  Depression, unspecified depression type Assessment & Plan: Uncontrolled.  Has tried sertraline before but not for long.  Agreeable to restart.   Start sertraline  25 mg once daily for one week and then increase to 50 mg thereafter. Rx sent.  F/u in 4 weeks.   Class 2 severe obesity due to excess calories with serious comorbidity and body mass index (BMI) of 37.0 to 37.9 in adult Assessment & Plan: Discussed the importance of healthy diet and exercise to affect sustainable weight loss.   Orders: -     Lipid panel -     Hemoglobin A1c -     TSH  Need for hepatitis C screening test -     Hepatitis C antibody  Screening for HIV (human immunodeficiency virus) -     HIV Antibody (routine testing w rflx)  Need for Tdap vaccination -     Tdap vaccine greater than or equal to 7yo IM  Anxiety and depression Assessment & Plan: Uncontrolled.  Has tried sertraline before but not for long.  Agreeable to restart.   Start sertraline 25 mg once daily for one week and then increase to 50 mg thereafter. Rx sent.  F/u in 4 weeks.  Orders: -     Sertraline HCl; Take 1 tablet (50 mg total) by mouth daily.  Dispense: 30 tablet; Refill: 0  Hearing loss of right ear, unspecified hearing loss type Assessment & Plan: Chronic.  No red flags on exam.  Referral placed for ENT.  Orders: -     Ambulatory referral to ENT     Return in about 4 weeks (around 12/24/2023) for depressoin.    Carrol Aurora, NP

## 2023-11-26 NOTE — Assessment & Plan Note (Signed)
 Immunizations tdap given today; declines influenza vaccine  Discussed the importance of a healthy diet and regular exercise in order for weight loss, and to reduce the risk of further co-morbidity.  Exam stable. Labs pending.  Follow up in 1 year for repeat physical.

## 2023-11-27 LAB — HIV ANTIBODY (ROUTINE TESTING W REFLEX)
HIV 1&2 Ab, 4th Generation: NONREACTIVE
HIV FINAL INTERPRETATION: NEGATIVE

## 2023-11-27 LAB — COMPREHENSIVE METABOLIC PANEL WITH GFR
AG Ratio: 1.5 (calc) (ref 1.0–2.5)
ALT: 38 U/L (ref 9–46)
AST: 23 U/L (ref 10–40)
Albumin: 4.7 g/dL (ref 3.6–5.1)
Alkaline phosphatase (APISO): 104 U/L (ref 36–130)
BUN: 11 mg/dL (ref 7–25)
CO2: 24 mmol/L (ref 20–32)
Calcium: 9.8 mg/dL (ref 8.6–10.3)
Chloride: 101 mmol/L (ref 98–110)
Creat: 0.95 mg/dL (ref 0.60–1.24)
Globulin: 3.1 g/dL (ref 1.9–3.7)
Glucose, Bld: 80 mg/dL (ref 65–99)
Potassium: 3.7 mmol/L (ref 3.5–5.3)
Sodium: 138 mmol/L (ref 135–146)
Total Bilirubin: 0.4 mg/dL (ref 0.2–1.2)
Total Protein: 7.8 g/dL (ref 6.1–8.1)
eGFR: 115 mL/min/1.73m2 (ref >=60)

## 2023-11-27 LAB — HEMOGLOBIN A1C
Hgb A1c MFr Bld: 5.4 % (ref <5.7)
Mean Plasma Glucose: 108 mg/dL
eAG (mmol/L): 6 mmol/L

## 2023-11-27 LAB — CBC
HCT: 42.2 % (ref 38.5–50.0)
Hemoglobin: 14.6 g/dL (ref 13.2–17.1)
MCH: 30.5 pg (ref 27.0–33.0)
MCHC: 34.6 g/dL (ref 32.0–36.0)
MCV: 88.3 fL (ref 80.0–100.0)
MPV: 10.3 fL (ref 7.5–12.5)
Platelets: 295 Thousand/uL (ref 140–400)
RBC: 4.78 Million/uL (ref 4.20–5.80)
RDW: 12.7 % (ref 11.0–15.0)
WBC: 13.3 Thousand/uL — ABNORMAL HIGH (ref 3.8–10.8)

## 2023-11-27 LAB — LIPID PANEL
Cholesterol: 227 mg/dL — ABNORMAL HIGH (ref <200)
HDL: 37 mg/dL — ABNORMAL LOW (ref >=40)
LDL Cholesterol (Calc): 142 mg/dL — ABNORMAL HIGH
Non-HDL Cholesterol (Calc): 190 mg/dL — ABNORMAL HIGH (ref <130)
Total CHOL/HDL Ratio: 6.1 (calc) — ABNORMAL HIGH (ref <5.0)
Triglycerides: 315 mg/dL — ABNORMAL HIGH (ref <150)

## 2023-11-27 LAB — HEPATITIS C ANTIBODY: Hepatitis C Ab: NONREACTIVE

## 2023-11-27 LAB — TSH: TSH: 1.92 m[IU]/L (ref 0.40–4.50)

## 2023-11-29 ENCOUNTER — Ambulatory Visit: Payer: Self-pay | Admitting: General Practice

## 2023-11-30 NOTE — Telephone Encounter (Unsigned)
 Copied from CRM [id number]. Topic: Clinical - Lab/Test Results >> Nov 30, 2023 12:54 PM Dedra B wrote: Reason for CRM: Pt returning call regarding lab results. Pls call pt

## 2024-01-25 ENCOUNTER — Encounter: Payer: Self-pay | Admitting: *Deleted

## 2024-06-06 ENCOUNTER — Ambulatory Visit: Admitting: General Practice
